# Patient Record
Sex: Male | Born: 1960 | Race: White | Hispanic: No | Marital: Married
Health system: Midwestern US, Community
[De-identification: ages and names within clinical notes are randomized; demographics above are authoritative.]

## PROBLEM LIST (undated history)

## (undated) DIAGNOSIS — E1165 Type 2 diabetes mellitus with hyperglycemia: Principal | ICD-10-CM

## (undated) DIAGNOSIS — Z794 Long term (current) use of insulin: Secondary | ICD-10-CM

---

## 2015-11-27 LAB — AMB EXT LIPID PANEL
HDL, External: 47
LDL-C, External: 78
Total Cholesterol, External: 146
Triglycerides, External: 104

## 2015-11-27 LAB — AMB EXT TSH: TSH, EXTERNAL: 1.21

## 2016-04-08 NOTE — Progress Notes (Signed)
[facility] [address] [place] [address] [phone]      DIABETES & ENDOCRINOLOGY SERVICES    Patient Name: Wayne [name], Wayne [name] Medical Record Number: [id number]  Account Number: [id number] DOB: 06 / 30 / 1962  Admit Date: 08 / 01 / 2017 Discharge Date: / /        DIABETES AND ENDOCRINOLOGY FOLLOWUP OFFICE NOTE    REFERRING PHYSICIAN:  M Jesusita Oka, MD    CONSULTING Kourosh Jablonsky:  Alferd Patee, APRN    SUPERVISING PHYSICIAN:  Marzella Schlein, MD            DATE:  04/08/2016    HISTORY OF PRESENT ILLNESS:  This is a 55 year old male with type 2 diabetes x10 years complicated by   hypertension and hyperlipidemia.  Currently logging and monitoring his blood sugars 2 to 3 times a day using an   Accu-Chek meter.  Pre breakfast 138 to 148.  He did have a blood sugar of 168   and 173 x1 occurrence.  Pre lunch 125, 139, 142, 155, 183.  Pre dinner 89 and   154.  Bedtime 71.  He recently retired in June.  He denies any hypoglycemia unawareness.    CURRENT DIABETES MEDICATIONS:  Actos 30 milligrams p.o. every day.  Januvia 100 milligrams p.o. every day.   Glyburide 10 milligrams p.o. in the morning and at suppertime.  Lantus 45 units   subcutaneously at suppertime.    REVIEW OF SYSTEMS:  Today, the patient denies fever, chills, and unintentional weight gain.  Denies   chest pain, shortness of breath, palpitations, or edema.  Denies cough, wheeze,   or accessory muscle use.  Denies heartburn, abdominal pain, or change in bowel   habits.  Denies incontinence.  Experiences 3 episodes of nocturia a night.   Denies symptoms of peripheral vascular disease or neuropathy    PHYSICAL EXAMINATION:  Weight today on a standing scale is 208.7 pounds.  This is down 1 pound since   last visit.  Blood pressure 110/60 with a pulse of 80.  Well-nourished,   well-appearing, 55 year old male with type 2 diabetes x10 years.  Neck was   supple.  No JVD.  Lung sounds:  Clear to auscultation bilaterally.  No cough,    wheeze, or accessory muscle use.  Heart rate:  Regular rate and rhythm; S1 and   S2 were normal.  Bilateral lower extremities showed no edema, clubbing, or   cyanosis.  Skin was intact.  No ulcers present.  Normal monofilament exam   bilaterally.  +2 dorsalis pedis and posterior tibialis pulses were palpated   bilaterally.  He is alert and oriented x3.    CURRENT DIABETES MEDICATIONS:  Actos 30 milligrams p.o. every day.  Januvia 100 milligrams p.o. every day.   Glyburide 10 milligrams p.o. in the morning and at suppertime.  Lantus 45 units   subcutaneously at suppertime.    ASSESSMENT AND PLAN:  1.  Type 2 diabetes currently out of control.  Moving Lantus administration  time from supper to bedtime.  He needs to monitor for hypoglycemia at bedtime.    He may need to decrease his glyburide as well.  Currently his fasting blood   sugars are not in goal range.  He is to update the office with blood sugars in   1 to 2 weeks to see if Lantus dose needs to be increased.  Discussed the need   for him to increase his blood sugars checks  throughout the day.  He also needs   to monitor his lunch blood sugars.  They are currently split 50/50 whether or   not they are in goal range or not.  Increase activity post breakfast to help   with these numbers.  A new meter was given to him.    2.  Hypertension is currently controlled on current regimen.    3.  Hyperlipidemia is currently controlled on current regimen    4.  Nephropathy.  Microalbumin was within normal limits.    If I can be of any further assistance, please do not hesitate to call.    ______________________________  Alferd Patee, APRN      ______________________________  Marzella Schlein, MD    CC/nls  D:  04/08/2016  10:31 AM  T:  04/08/2016  12:56 PM  DVI  #:  [id number]  Fusion Job #:  [id number]  Doc#:  [id number]    CC: Alferd Patee, APRN  [facility]  [address]  [place], [place] [address] 2013  Fax#:  [phone]  M Jesusita Oka, MD   [address]  [place], [place] [address] [phone]  Marzella Schlein, MD  [facility]  [address]  [place], [place] [address]  Fax#:  [phone]    Electronically Authenticated and Linus Orn by:  Alferd Patee, APRN On 04/09/2016 04:01 PM EDT  Electronically Authenticated by:  Marzella Schlein, MD On 04/10/2016 12:44 PM EDT

## 2016-07-09 NOTE — Progress Notes (Signed)
[facility] [address] [place] [id number] [phone]      DIABETES & ENDOCRINOLOGY SERVICES    Patient Name: Wayne [name] , Wayne [name] Medical Record Number: [id number]  Account Number: [id number] DOB: 06 / 30 / 1962  Admit Date: 11 / 01 / 2017 Discharge Date: / /    REFERRING PHYSICIAN:  Jesusita OkaKevin Cozzi, MD      CONSULTING Ziare Cryder:  Alferd Pateehristine Carver, APRN      SUPERVISING PHYSICIAN:  Janice CoffinElizabeth Spatola, MD      DATE:  07/09/2016    HISTORY OF PRESENT ILLNESS:  This is a 55 year old male with type 2 diabetes x10 years complicated by   hypertension and hyperlipidemia.  Currently logging and monitoring his blood   sugars 2 to 3 times a day using an Accu-Chek Aviva meter.  He varies his Lantus   dose from 40 to 45 units subcutaneous at h.s. depending on his  activity that he had previously in the day.  This is to prevent hypoglycemia.  He currently denies any hypoglycemia.    CURRENT DIABETES MEDICATIONS:  Januvia 100 mg p.o. every day, Actos 30 mg p.o. every day, glyburide 10 mg p.o.   in the morning and at suppertime, Lantus 40 to 45 units subcutaneous at h.s.   depending on previous activity of the day.    REVIEW OF SYSTEMS:  Today the patient denies fever, chills or unintentional weight gain.  Denies   chest pain, shortness of breath, palpitations or edema.  Denies cough, wheeze  or accessory muscle use.  Denies heartburn, abdominal pain or change in bowel   habits.  Denies nocturia or incontinence.  Denies symptoms of PVD or neuropathy.    PHYSICAL EXAMINATION:  Weight today on a standing scale is 212.2.  This is up 4 pounds.  Blood  pressure 132/62 with a pulse of 89.  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  Neck was supple.  No JVD.  Lung sounds clear to auscultation bilaterally.  No cough, wheeze or accessory   muscle use.  Heart rate:  Regular rate and rhythm.  S1, S2 was normal.  Bilateral extremities show no edema, clubbing or cyanosis.  Skin was intact.   No ulcers present.  Normal monofilament exam.  Plus 2 dorsalis pedis and   posterior tibialis pulses palpated bilaterally.  He is alert and oriented x3.    CURRENT DIABETES MEDICATIONS:  Januvia 100 mg p.o. every day, Actos 30 mg p.o. every day, glyburide 10 mg p.o.   in the morning and at suppertime, Lantus 40 to 45 units subcutaneous at h.s.   depending on activity of the day.    ASSESSMENT AND PLAN:  1.  Type 2 diabetes, currently controlled.  He does very well with ranging his   Lantus based off of previous activity of the day to prevent hypoglycemia.  His   A1c continues to improve. It has decreased from 7.7 to 7.2 on the [facility] Vantage   analyzer machine. No change is made today.  He is to call with any concerns.  2.  Hypertension is currently controlled on current regimen.  3.  Hyperlipidemia is currently controlled on current regimen.  4.  Nephropathy.  Microalbumin is within normal limits.    If I can be of any further assistance, please do not hesitate to call.            ______________________________  Alferd Pateehristine Carver, APRN  ______________________________  Marzella SchleinElizabeth J Spatola, MD    CC/pc  D:  07/09/2016  11:30 AM  T:  07/10/2016  09:56 PM  DVI  #:  [id number]  Fusion Job #:  [id number]  Doc#:  [id number]    CC: Alferd Pateehristine Carver, APRN  [facility]  [address]  [place], [place] [id number] 2013  Fax#:  [phone]  Coralie CommonM Kevin Cozzi, MD  [address]  [place], [place] [id number] [phone]  Marzella SchleinElizabeth J Spatola, MD  [facility]  [address]  [place], [place] [id number]  Fax#:  [phone]    Electronically Authenticated and Linus OrnEdited by:  Alferd Pateehristine Carver, APRN On 07/11/2016 09:44 AM EDT  Electronically Authenticated by:  Marzella SchleinElizabeth J Spatola, MD On 07/18/2016 10:08 AM EST

## 2016-10-16 NOTE — Progress Notes (Signed)
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 Currently logging and monitoring his blood   sugars 1-2 times a day using an Accu-Chek Aviva meter.  [place]   [place], he came back 2 days ago. He states during that time he   was not doing his typical activity and diet.  He notes that he did have 2 blood   sugars of 76 and 78 after exercise, without a snack, when he took glyburide.    He is hypoglycemic aware.    MEDICATIONS:  Januvia 100 mg p.o. every day, Actos 30 mg p.o. every day, glyburide 10 mg p.o.   in the morning and at suppertime, and Lantus 40-45 units subcu at h.s.   depending on activity and diet.    REVIEW OF SYSTEMS:  Today, the patient denies fever, chills, or unintentional weight gain.  Denies   chest pain, shortness of breath, palpitations, or edema.  Denies cough, wheeze,   or accessory muscle use.  Denies heartburn, abdominal pain, or change in bowel   habits.  Denies nocturia or incontinence.  Denies symptoms of PVD or neuropathy.    PHYSICAL EXAM:  VITAL SIGNS:  Weight today on a standing scale 210.2; this is down 2 pounds.    Blood pressure 112/62 with a pulse of 74.  GENERAL:  Well-nourished, well-appearing 56 year old male with type 2 diabetes   x10 years.  NECK:  Supple.  No JVD.     LUNGS:  Clear to auscultation bilaterally.  No cough, wheeze, or accessory   muscle use.  HEART:  Regular rate and rhythm.  S1, S2 normal.  EXTREMITIES:  Bilateral extremities show no edema, clubbing, or cyanosis.  Skin    was intact.  No ulcers present.  Normal monofilament exam.  +2 dorsalis pedis   and posterior tibialis pulses palpated bilaterally.  NEUROLOGIC:  He is alert and oriented x3.    ASSESSMENT AND PLAN:  1. Type 2 diabetes, currently out of control.  [place] was in [place]   approximately 3 weeks with poor diet and activity regimen at that time.  We   will monitor his blood sugars and A1c now that he is back to his typical diet   and activity.  It has increased from 7.2 to 7.5 on the [facility] Vantage Analyzer   machine.  He is going to update the office with his blood sugars to see whether   or not medications need to be changed now that he is back home.  Also discussed   that he should monitor his   hypoglycemia with exercise and glyburide administration.  Discussed he may need   to cut his dose in half or eliminate glyburide completely on the days that he   exercises.  He states he is comfortable exercising with taking the full dose of   glyburide if he eats a snack.  He says there were only 2 occurrences of  blood   sugars in the upper seventie. He is hypoglycemic aware.  2. Hypertension is currently controlled on current regimen.  3. Hyperlipidemia is currently controlled on current regimen.  4. Nephropathy.  Microalbumin is within normal limits.    If I can be of any further assistance, please do not hesitate to call.        ____________________________________  Otho Darner, APRN      ____________________________________  Marzella Schlein, MD    CC/MODL  D:  10/16/2016 11:12:53  T:  10/16/2016 11:28:55  MMODAL  Job#: [id number]  Doc#:  [id number]      CC: Coralie Common, MD  Electronically Authenticated and Edited by:  Otho Darner On 10/17/2016 03:48 PM EST  Electronically Authenticated by:  Marzella Schlein, MD On 10/28/2016 10:06 AM EST

## 2017-03-06 ENCOUNTER — Ambulatory Visit: Attending: "Endocrinology

## 2017-04-17 MED ORDER — BLOOD SUGAR DIAGNOSTIC TEST STRIPS
ORAL_STRIP | 3 refills | Status: AC
Start: 2017-04-17 — End: ?

## 2017-04-17 NOTE — Telephone Encounter (Signed)
Accu chek diabetic test strips   Twice a day  90 day supply

## 2017-05-17 MED ORDER — ROSUVASTATIN 20 MG TAB
20 mg | ORAL_TABLET | ORAL | 3 refills | Status: DC
Start: 2017-05-17 — End: 2018-06-13

## 2017-06-02 ENCOUNTER — Encounter: Attending: "Endocrinology

## 2017-07-16 MED ORDER — PIOGLITAZONE 30 MG TAB
30 mg | ORAL_TABLET | ORAL | 3 refills | Status: AC
Start: 2017-07-16 — End: ?

## 2017-07-20 MED ORDER — PEN NEEDLE, DIABETIC 32 GAUGE X 5/32"
32 gauge x 5/" | PEN_INJECTOR | 3 refills | Status: DC
Start: 2017-07-20 — End: 2018-07-28

## 2017-08-25 ENCOUNTER — Ambulatory Visit: Admit: 2017-08-25 | Discharge: 2017-08-25 | Payer: PRIVATE HEALTH INSURANCE | Attending: "Endocrinology

## 2017-08-25 DIAGNOSIS — E1165 Type 2 diabetes mellitus with hyperglycemia: Secondary | ICD-10-CM

## 2017-08-25 LAB — AMB POC HEMOGLOBIN A1C: Hemoglobin A1c (POC): 7.5 %

## 2017-08-25 MED ORDER — DAPAGLIFLOZIN 5 MG TABLET
5 mg | ORAL_TABLET | Freq: Every day | ORAL | 3 refills | Status: DC
Start: 2017-08-25 — End: 2017-09-09

## 2017-08-25 NOTE — Progress Notes (Addendum)
[facility] DI[facility] AT THE [facility]         Patient Name: Wayne MuttersHarold Mersman   MRN: [id number]  DOB: 1961-08-24  Date: 08/25/17      CONSULTING PHYSICIAN:  Marzella SchleinElizabeth J Lee-Anne Flicker, MD    DATE OF CONSULT:   08/25/17        HISTORY OF PRESENT ILLNESS:     Wayne [name] is a 56 y.o. male with type 2 diabetes times 11 years who presents for a follow up visit.  The patient has not been here for 10 months.   The patients diabetes is complicated by hypertension and hyperlipidemia. The patient is testing his  blood sugars 2 times per day although he has periods of time when he does not check his blood sugars at all. The patient is using a Accu-Chek of Aviva meter. The patient is exercising.  Patient exercises 3-4 times per week biking, weight lifting or boxing.    The patient is currently taking   Basaglar 45 units at bedtime  Actos 30 mg a day  Januvia 100 mg a day  Glyburide 10 mg twice a day    The patient does feel his low blood sugars.  His only low readings occur after exercise in the afternoon.  The patient does not feel of this happens often    Bydureon side effect nausea    Review of Systems     Patient denies any chest pain shortness of breath claudication symptoms or neuropathic discomfort      Diabetes Medication   Key Antihyperglycemic Medications             insulin glargine (BASAGLAR KWIKPEN U-100 INSULIN) 100 unit/mL (3 mL) inpn  (Taking) by SubCUTAneous route. 45 units daily    dapagliflozin (FARXIGA) 5 mg tab tablet  (Taking) Take 1 Tab by mouth daily.    pioglitazone (ACTOS) 30 mg tablet  (Taking) TAKE 1 TABLET DAILY.    glyBURIDE (DIABETA) 5 mg tablet  (Taking) Take 10 mg by mouth two (2) times daily (with meals).    SITagliptin (JANUVIA) 100 mg tablet  (Taking) Take 100 mg by mouth daily.                    Visit Vitals  BP 120/62   Pulse 84   Wt 212 lb 1.6 oz (96.2 kg)       PHYSICAL EXAMINATON:    General:  Well-nourished well-developed male   Lungs:  Clear to auscultation  Heart:  Regular rate and rhythm S1-S2 were normal  Neuro:  Alert and oriented x3, cranial nerves 2-12 intact, normal monofilament exam  Extremities:  No clubbing cyanosis or edema, 1+ dorsalis pedis pulses         Results for orders placed or performed in visit on 08/25/17   AMB POC HEMOGLOBIN A1C   Result Value Ref Range    Hemoglobin A1c (POC) 7.5 %           ASSESSMENT AND PLAN:    This is a 56 y.o. year old  male with type 2 diabetes times 11 years    # 1: Diabetes:   Out of control.  Patient's A1c has been stable in the 7.2-7.5 range.  He was tried on Bydureon and developed nausea.  He also was tried on Invokana and in a very short amount of time just felt awful and could not get out of bed.  I am wondering if this was really  related to the Invokana.  I have asked him to try ComorosFarxiga which he is willing to do.  His A1c is elevated and would do better at a lower level.  If he cannot tolerate the SGL T2 then we will change him to short-acting insulin along with the Basaglar.    #2: Lipids:  Patient continues on Crestor    #3: Nephropathy:  The patient needs to have a microalbumin and GFR done    #4: HTN:   Patient continues on hydrochlorothiazide and Cozaar    #5: PVD:  Patient denies claudication symptoms    #6: Neuropathy:  Patient denies neuropathic discomfort    #7: Cardiac:  Patient denies chest pain or shortness of breath    #8: Retinopathy:  Patient [facility]      If I can be of any further assistance please do not hesitate to call.      Marzella SchleinElizabeth J Kharis Lapenna, MD

## 2017-08-25 NOTE — Telephone Encounter (Signed)
Called patient to inform him that once he starts the ComorosFarxiga med, he is to stop the HCTZ, per Dr Barrie FolkSpatola. Patient is receptive

## 2017-09-04 ENCOUNTER — Inpatient Hospital Stay: Admit: 2017-09-04 | Payer: PRIVATE HEALTH INSURANCE

## 2017-09-04 DIAGNOSIS — E1165 Type 2 diabetes mellitus with hyperglycemia: Secondary | ICD-10-CM

## 2017-09-04 LAB — MICROALBUMIN, UR, RAND
Creatinine, urine random: 46.9 mg/dL
Microalbumin, urine: <5 mg/L

## 2017-09-04 LAB — METABOLIC PANEL, BASIC
Anion gap: 1 mmol/L — ABNORMAL LOW (ref 5–15)
BUN/Creatinine ratio: 22 — ABNORMAL HIGH (ref 12–20)
BUN: 21 MG/DL — ABNORMAL HIGH (ref 7–18)
CO2: 29 mmol/L (ref 21–32)
Calcium: 8.9 MG/DL (ref 8.5–10.1)
Chloride: 107 mmol/L (ref 98–110)
Creatinine: 0.96 MG/DL (ref 0.70–1.30)
GFR est AA: >60 mL/min/{1.73_m2} (ref >60)
GFR est non-AA: >60 mL/min/{1.73_m2} (ref >60)
Glucose: 136 mg/dL — ABNORMAL HIGH (ref 70–100)
Potassium: 4.9 mmol/L (ref 3.5–5.1)
Sodium: 137 mmol/L (ref 136–145)

## 2017-09-04 LAB — HEPATIC FUNCTION PANEL
A-G Ratio: 1.2 (ref 1.0–3.0)
ALT (SGPT): 41 U/L (ref 16–61)
AST (SGOT): 29 U/L (ref 15–37)
Albumin: 3.8 g/dL (ref 3.4–5.0)
Alk. phosphatase: 65 U/L (ref 45–117)
Bilirubin, direct: 0.16 mg/dL (ref 0.00–0.20)
Bilirubin, total: 0.46 mg/dL (ref 0.20–1.20)
Globulin: 3.3 g/dL (ref 2.5–3.5)
Protein, total: 7.1 g/dL (ref 6.4–8.2)

## 2017-09-04 LAB — LIPID PANEL W/ REFLX DIRECT LDL
CHOL/HDL Ratio: 3.4 (ref 1–3.4)
Cholesterol, total: 169 MG/DL (ref 0–200)
HDL Cholesterol: 49 MG/DL — ABNORMAL LOW (ref >59)
LDL, calculated: 90.8 MG/DL (ref <100)
N-HDL-C: 120 mg/dL (ref <130)
Triglyceride: 146 MG/DL (ref 0–150)

## 2017-09-09 MED ORDER — DAPAGLIFLOZIN 5 MG TABLET
5 mg | ORAL_TABLET | Freq: Every day | ORAL | 3 refills | Status: DC
Start: 2017-09-09 — End: 2018-07-28

## 2017-09-09 NOTE — Telephone Encounter (Signed)
Hal needs a prescription for Farxiga 5 mg tabs, 1 daily, #90  called to [facility].  [facility] would not pay for it.

## 2017-09-09 NOTE — Telephone Encounter (Signed)
Completed.

## 2017-09-30 MED ORDER — GLYBURIDE 5 MG TAB
5 mg | ORAL_TABLET | Freq: Two times a day (BID) | ORAL | 3 refills | Status: AC
Start: 2017-09-30 — End: ?

## 2017-09-30 MED ORDER — SITAGLIPTIN 100 MG TAB
100 mg | ORAL_TABLET | Freq: Every day | ORAL | 3 refills | Status: AC
Start: 2017-09-30 — End: ?

## 2017-09-30 NOTE — Telephone Encounter (Signed)
Wayne [name] needs a refill of his Januvia 100 mg.  He takes 1 tab daily, 90 day supply.  He also needs Glyburide 5 mg. 2 tabs twice a day, 90 day supply    [facility]

## 2017-09-30 NOTE — Telephone Encounter (Signed)
Completed.

## 2017-11-25 ENCOUNTER — Encounter: Attending: Registered Nurse

## 2018-01-04 ENCOUNTER — Ambulatory Visit: Admit: 2018-01-04 | Discharge: 2018-01-04 | Payer: PRIVATE HEALTH INSURANCE | Attending: Registered Nurse

## 2018-01-04 DIAGNOSIS — E1165 Type 2 diabetes mellitus with hyperglycemia: Secondary | ICD-10-CM

## 2018-01-04 LAB — AMB POC HEMOGLOBIN A1C: Hemoglobin A1c (POC): 6.2 %

## 2018-01-04 NOTE — Progress Notes (Signed)
[facility] DIABETES & ENDOCRINOLOGY SERVICES AT THE CARDIOVASCULAR CENTER         Patient Name: Wayne [name]   MRN: [id number]  DOB: 06-14-1961  Date: 01/04/18    Consulting provider:  Otho Darner, FNP    Supervising physician:  Marzella Schlein, MD    DATE OF CONSULT:   01/04/18        HISTORY OF PRESENT ILLNESS:  Wayne [name] is a 57 y.o. male with type 2 diabetes times 11 years complicated by hypertension and hyperlipidemia who presents for a follow up visit.  Currently monitoring his blood sugars one to 2 times a day using Accu-Chek of even meter.  He does not have his meter or log book with him.  He denies any hypoglycemia.  He just returned from [place].  He was down there for approximately three weeks.  He may be moving down to [place] next winter permanently.  They have not decided whether or not they want to permantely move and sell their house in [place]. He is actively trying to lose weight.  He is monitoring his portion size as well as decrease his snacking post supper and prior to going to bed.  He is currently working out three to 4 times a week.  Commonly using the treadmill, weights and recumbent bike.  He is currently taking glyburide 5-10 mg by mouth twice daily.  He ranges his dose based on his diet and activity.  He also decreased his basalglar insulin and is taking 40 units subcutaneously daily.  He previously was ranging 40-45 units before he consistently started taking 40 units.      Review of Systems   Today the patient denies fever, chills or unintentional weight gain.  Denies chest pain, shortness of breath, palpitations or edema.  Denies cough, wheeze or accessory muscle use.  Denies heartburn, abdominal pain or change in bowel habits.  Denies nocturia incontinence.  Denies symptoms of PVD or neuropathy.      Diabetes Medication   Key Antihyperglycemic Medications             SITagliptin (JANUVIA) 100 mg tablet Take 1 Tab by mouth daily.     glyBURIDE (DIABETA) 5 mg tablet Take 2 Tabs by mouth two (2) times daily (with meals).    dapagliflozin (FARXIGA) 5 mg tab tablet Take 1 Tab by mouth daily.    insulin glargine (BASAGLAR KWIKPEN U-100 INSULIN) 100 unit/mL (3 mL) inpn by SubCUTAneous route. 40-45 units daily    pioglitazone (ACTOS) 30 mg tablet TAKE 1 TABLET DAILY.                    Visit Vitals  BP 116/62 (BP 1 Location: Right arm, BP Patient Position: Sitting)   Pulse 64   Wt 205 lb (93 kg)       PHYSICAL EXAMINATON:  General:  Well-nourished well-appearing 57 year old male with type 2 diabetes times 11 years.  Neck/thyroid:   Supple, no JVD.  Lungs:  Clear to auscultation bilaterally.  No cough, wheeze or accessory muscle use.  Heart:  Regular rate and rhythm.  S1-S2 is normal.  Extremities:  Bilateral extremities show no edema, clubbing or cyanosis.  Skin was intact. No ulcers present.  Normal monofilament exam bilaterally.  Plus two dorsalis pedis and posterior tibialis pulses palpated bilaterally.  Neuro:  Alert and oriented x3.    Results for orders placed or performed in visit on 01/04/18   AMB POC HEMOGLOBIN A1C  Result Value Ref Range    Hemoglobin A1c (POC) 6.2 %         ASSESSMENT AND PLAN:    This is a 57 y.o. year old  male with type 2 diabetes times 11 years.    1: Diabetes:   Controlled.  His A1c has decreased from 7.5-6.2.  He is extremely happy with this result.  His blood sugars have  done very well since started on Farxiga.  Discussed we do not want his A1c to go less than 6.0.  He is to trial decreasing his glyburide twice daily.  Currently he is taking 10 mg by mouth twice daily.  He is to take 5 mg by mouth twice daily.  Discussed for him to trial at 0 mg on days that he is active to see how his blood sugars respond.  He understands he needs to re-evaluate his blood sugars post exercise and or add a decreased dose of his medications.  He is reminded to monitor for hypoglycemia with glyburide and basalglar  administration.  He understands he is at risk for hypoglycemia with increased weight loss, activity and small food portion sizes with these two medications specifically.  Reviewed blood sugar goals he is to strive for throughout the day.  Reviewed the positive benefits that lifestyle changes has on his blood sugars and A1c results.  He feels comfortable treating a hypoglycemic event.  He is to continue to check his blood sugars at various times throughout the day.  He is to call the office with any questions or concerns.    2: HTN:  Controlled.    3: Lipids:   Controlled.    4: Nephropathy:  Microalbumin was within normal limits.    5. Retinopathy:    Last examination November 25 2016 at [facility]. Per patient, he has a follow-up appointment scheduled next week.     If I can be of any further assistance please do not hesitate to call.      Otho Darner, FNP

## 2018-02-08 MED ORDER — INSULIN GLARGINE 100 UNIT/ML (3 ML) SUB-Q PEN
100 unit/mL (3 mL) | PEN_INJECTOR | Freq: Every day | SUBCUTANEOUS | 3 refills | Status: DC
Start: 2018-02-08 — End: 2019-02-22

## 2018-02-08 NOTE — Telephone Encounter (Signed)
Wayne [name] needs a refill of his Basaglar 40 mg.  He takes 1 daily and gets a 90 day supply.

## 2018-02-08 NOTE — Telephone Encounter (Signed)
Completed.

## 2018-04-05 ENCOUNTER — Ambulatory Visit: Admit: 2018-04-05 | Discharge: 2018-04-05 | Payer: PRIVATE HEALTH INSURANCE | Attending: Registered Nurse

## 2018-04-05 ENCOUNTER — Ambulatory Visit

## 2018-04-05 DIAGNOSIS — E1165 Type 2 diabetes mellitus with hyperglycemia: Secondary | ICD-10-CM

## 2018-04-05 LAB — AMB POC HEMOGLOBIN A1C
Hemoglobin A1C, POC: 6.4 %
Hemoglobin A1c (POC): 6.4 %

## 2018-04-05 NOTE — Progress Notes (Signed)
Progress Notes by Otho Darnerhristophersen, Anthea Udovich at 04/05/18 1130                Author: Otho Darnerhristophersen, Johnwilliam Shepperson  Service: --  Author Type: Nurse Practitioner       Filed: 04/05/18 1208  Encounter Date: 04/05/2018  Status: Signed          Editor: Joanthony Hamza, [facility]                          [facility]             Patient Name: Wayne [name]    MRN: [id number]   DOB: February 15, 1961   Date: 04/05/18      Consulting provider:   Otho Darnerhristine Tyqwan Pink, FNP      Supervising physician:   Wayne SchleinElizabeth J Spatola, MD      DATE OF CONSULT:    04/05/18            HISTORY OF PRESENT ILLNESS:   Wayne [name] is a 57 y.o.  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  Currently monitoring his blood sugars one to 2 times a day using Accu-Chek  Avia meter.  Unfortunately, he does not have his meter or log book with him.   Per patient, his blood sugars highest at 120 in the morning.  He had two blood sugars less than 80 when he took his medication and did not eat.  He denies  this as a recurring occurrence.  He is hypoglycemic aware and does not need assistance from others.  He feels comfortable treating a hypoglycemic event.  He admits he has had increased amount of stress the past 3-4 weeks. He is currently was in the [place]  [place].   Due to him being busy with moving and selling HIS [place] home he has also had decreased exercise regimen.         Review of Systems    Today the patient denies fever, chills or unintentional weight gain.  Denies chest pain, shortness of breath, palpitations or edema.  Denies cough, wheeze or accessory muscle use.  Denies heartburn,  abdominal pain or change in bowel habits.  Denies nocturia incontinence.  Denies symptoms of PVD or neuropathy.         Diabetes Medication      Key  Antihyperglycemic Medications                             insulin glargine (BASAGLAR KWIKPEN U-100 INSULIN) 100 unit/mL (3 mL) inpn  40 Units by SubCUTAneous route daily.           SITagliptin (JANUVIA) 100 mg tablet  Take 1 Tab by mouth daily.       glyBURIDE (DIABETA) 5 mg tablet  Take 2 Tabs by mouth two (2) times daily (with meals).       dapagliflozin (FARXIGA) 5 mg tab tablet  Take 1 Tab by mouth daily.           pioglitazone (ACTOS) 30 mg tablet  TAKE 1 TABLET DAILY.                               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   Neck/thyroid:   Supple, no JVD.   Lungs:  Clear to auscultation bilaterally.  No cough, wheeze or accessory muscle use.   [name]:  Regular rate and rhythm.  S1-S2 is normal.   Extremities:  Bilateral extremities show no edema, clubbing or cyanosis.  Skin was intact. No ulcers present.  Normal monofilament exam bilaterally.  Plus two dorsalis pedis and posterior tibialis pulses palpated bilaterally.   Neuro:  Alert and oriented x3.        Results for orders placed or performed in visit on 04/05/18     AMB POC HEMOGLOBIN A1C         Result  Value  Ref Range            Hemoglobin A1c (POC)  6.4  %              ASSESSMENT AND PLAN:      This is a 57 y.o. year old  male  with type 2 diabetes times 11 years.      1: Diabetes:   Controlled.  His A1c has increased from 6.2-6.4.  Per patient, his blood sugars are in goal range.  He denies hypoglycemia besides two occurrences when he took his medication and did not eat. He understands he needs to prevent this from  occurring.  We will continue with current medications as ordered.  He is going to be moving to [place] permanently.  He understands he can call our office with any concerns while he was in [place] until he  sees a provider down there.  He understands the  positive benefits that lifestyle choices will have on his blood sugar and A1c results.  He is to continue to check his blood sugars at various times throughout the day.  He is update the office with blood sugars and call with any concerns.                                                2: HTN:  Controlled.      3: Lipids:   Controlled.      4: Nephropathy:  Microalbumin was within normal limits.      5. Retinopathy:    Last examination November 25 2016 at [facility].      If I can be of any further assistance please do not hesitate to call.         Otho Darner, FNP

## 2018-04-05 NOTE — Progress Notes (Signed)
[facility]         Patient Name: Wayne [name]   MRN: [id number]  DOB: Aug 17, 1961  Date: 04/05/18    Consulting provider:  Otho Darnerhristine Kamuela Magos, FNP    Supervising physician:  Marzella SchleinElizabeth J Spatola, MD    DATE OF CONSULT:   04/05/18        HISTORY OF PRESENT ILLNESS:  Wayne [name] is a 57 y.o. 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  Currently monitoring his blood sugars one to 2 times a day using Accu-Chek Avia meter.  Unfortunately, he does not have his meter or log book with him.   Per patient, his blood sugars ???highest at 120 in the morning???.  He had two blood sugars less than 80 when he took his medication and did not eat.  He denies this as a recurring occurrence.  He is hypoglycemic aware and does not need assistance from others.  He feels comfortable treating a hypoglycemic event.  He admits he has had increased amount of stress the past 3-4 weeks. He is currently was [place].   Due to him being busy with moving and selling HIS [place] home he has also had decreased exercise regimen.      Review of Systems   Today the patient denies fever, chills or unintentional weight gain.  Denies chest pain, shortness of breath, palpitations or edema.  Denies cough, wheeze or accessory muscle use.  Denies heartburn, abdominal pain or change in bowel habits.  Denies nocturia incontinence.  Denies symptoms of PVD or neuropathy.      Diabetes Medication   Key Antihyperglycemic Medications             insulin glargine (BASAGLAR KWIKPEN U-100 INSULIN) 100 unit/mL (3 mL) inpn 40 Units by SubCUTAneous route daily.    SITagliptin (JANUVIA) 100 mg tablet Take 1 Tab by mouth daily.    glyBURIDE (DIABETA) 5 mg tablet Take 2 Tabs by mouth two (2) times daily (with meals).     dapagliflozin (FARXIGA) 5 mg tab tablet Take 1 Tab by mouth daily.    pioglitazone (ACTOS) 30 mg tablet TAKE 1 TABLET DAILY.                    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  Neck/thyroid:   Supple, no JVD.  Lungs:  Clear to auscultation bilaterally.  No cough, wheeze or accessory muscle use.  Heart:  Regular rate and rhythm.  S1-S2 is normal.  Extremities:  Bilateral extremities show no edema, clubbing or cyanosis.  Skin was intact. No ulcers present.  Normal monofilament exam bilaterally.  Plus two dorsalis pedis and posterior tibialis pulses palpated bilaterally.  Neuro:  Alert and oriented x3.    Results for orders placed or performed in visit on 04/05/18   AMB POC HEMOGLOBIN A1C   Result Value Ref Range    Hemoglobin A1c (POC) 6.4 %         ASSESSMENT AND PLAN:    This is a 57 y.o. year old  male with type 2 diabetes times 11 years.    1: Diabetes:   Controlled.  His A1c has increased from 6.2-6.4.  Per patient, his blood sugars are in goal range.  He denies hypoglycemia besides two occurrences when he took his medication and did not eat. He understands he needs to prevent this from occurring.  We will continue with current medications as ordered.  He is going to be moving to [place] permanently.  He understands he can call our office with any concerns while he was in [place] until he sees a provider down there.  He understands the positive benefits that lifestyle choices will have on his blood sugar and A1c results.  He is to continue to check his blood sugars at various times throughout the day.  He is update the office with blood sugars and call with any concerns.                                             2: HTN:  Controlled.    3: Lipids:   Controlled.     4: Nephropathy:  Microalbumin was within normal limits.    5. Retinopathy:    Last examination November 25 2016 at [facility].    If I can be of any further assistance please do not hesitate to call.      Otho Darner, FNP

## 2018-06-14 MED ORDER — ROSUVASTATIN 20 MG TAB
20 mg | ORAL_TABLET | ORAL | 0 refills | Status: AC
Start: 2018-06-14 — End: ?

## 2018-07-28 ENCOUNTER — Encounter

## 2018-07-28 MED ORDER — DAPAGLIFLOZIN 5 MG TABLET
5 mg | ORAL_TABLET | Freq: Every day | ORAL | 3 refills | Status: DC
Start: 2018-07-28 — End: 2018-10-25

## 2018-07-28 MED ORDER — PEN NEEDLE, DIABETIC 32 GAUGE X 5/32"
32 gauge x 5/" | PEN_INJECTOR | 3 refills | Status: AC
Start: 2018-07-28 — End: ?

## 2018-07-28 NOTE — Telephone Encounter (Signed)
dapagliflozin (FARXIGA) 5 mg tab tablet    Insulin Needles, Disposable, (NANO PEN NEEDLE) 32 gauge x 5/32"    Patient states [facility] has sent these already.  No faxes found.    Patient [place] to find a new [place] in [place]  Moved 3 weeks ago.

## 2018-07-28 NOTE — Telephone Encounter (Signed)
Last apt: 04/05/2018  Next apt: NA  Last apt: 04/05/2018

## 2018-10-25 ENCOUNTER — Encounter

## 2018-10-25 MED ORDER — DAPAGLIFLOZIN 5 MG TABLET
5 mg | ORAL_TABLET | ORAL | 1 refills | Status: AC
Start: 2018-10-25 — End: ?

## 2018-10-25 NOTE — Telephone Encounter (Signed)
Last apt: 04/05/2018  Next apt: NA  Last labs: 04/05/2018

## 2018-10-25 NOTE — Telephone Encounter (Signed)
re : Marcelline Deist from [facility]    See attached media document

## 2019-02-22 ENCOUNTER — Encounter

## 2019-02-22 MED ORDER — BASAGLAR KWIKPEN U-100 INSULIN 100 UNIT/ML (3 ML) SUBCUTANEOUS
100 unit/mL (3 mL) | PEN_INJECTOR | Freq: Every day | SUBCUTANEOUS | 3 refills | Status: AC
Start: 2019-02-22 — End: ?

## 2019-02-22 NOTE — Telephone Encounter (Signed)
BASAGLAR KWIK PEN 100 U/ML   INJECT 40 UNITS DAILY

## 2019-02-22 NOTE — Telephone Encounter (Signed)
Last apt: 04/05/2018  Next apt: NA  Last labs: 04/05/2018

## 2020-09-20 NOTE — Telephone Encounter (Signed)
Patient now lives in [place] he said to thank you for all the care he received over the years.

## 2022-05-09 IMAGING — MR MRI LUMBAR SPINE WITHOUT CONTRAST
4 of 6 series · 11 of 48 positions shown · IV contrast (gadolinium)
Comparison: None.

________________________________________________________________________________________________ 
******** ADDENDUM #1 ********/n 
Comparison is made to MRI from March 24, 2019 which is now available. 
Since that examination, the disc bulge at L4-L5 has progressed with progression 
in degree of central canal and bilateral subarticular recess narrowing. There is 
a residual left foraminal disc herniation at L4-L5 which has improved in size 
since prior MRI, resulting in improvement in left neural foraminal narrowing. 
Remainder of the exam is stable. 
MRI LUMBAR SPINE WITHOUT CONTRAST, 05/09/2022 [DATE]: 
CLINICAL INDICATION: Pain across lower back which radiates down buttocks and 
bilateral legs.
TECHNIQUE: Multiplanar, multiecho position MR images of the lumbar spine were 
performed without intravenous gadolinium enhancement. Patient was scanned on a 
3T magnet.

[Series 201: survey · axial · 10.0mm · 1.39mm/px · z∈[+152,+367]mm · 3 of 9 slices shown]
[im 1/9]
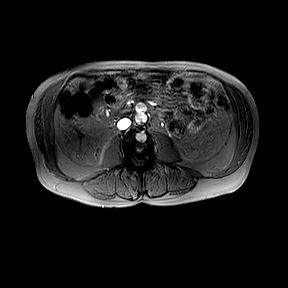
[im 6/9]
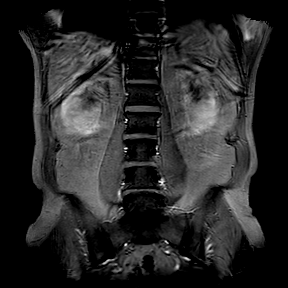
[im 9/9]
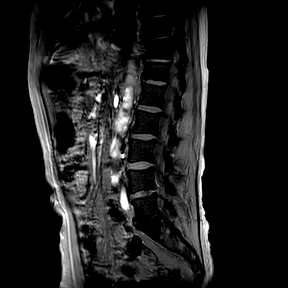

[Series 301: t2w_cor-surv · coronal · 6.0mm · 0.50mm/px · 2 of 5 slices shown]
[im 1/5]
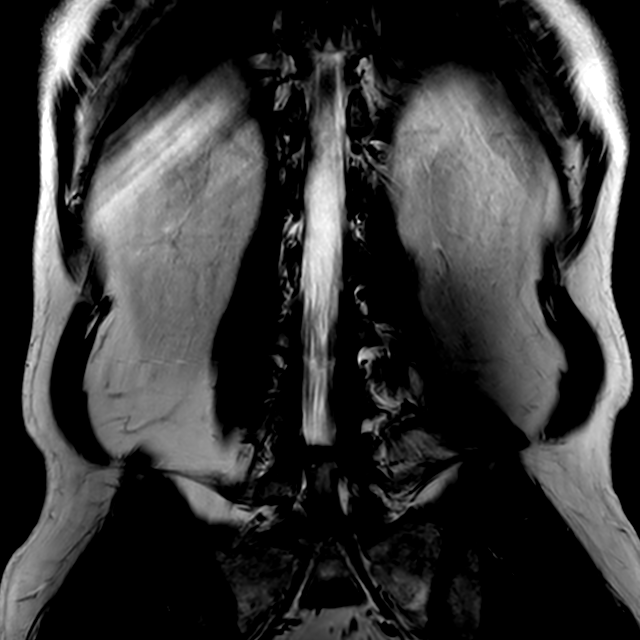
[im 5/5]
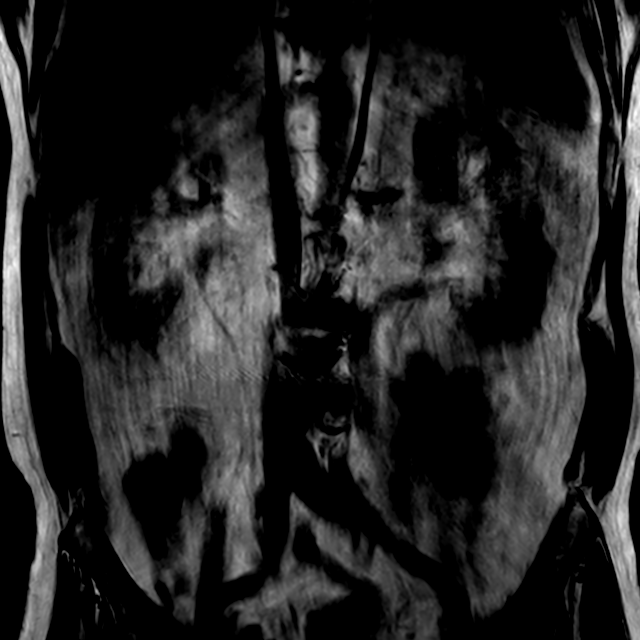

[Series 401: t2w_tse sag · sagittal · 4.0mm · 0.31mm/px · 3 of 17 slices shown]
[im 3/17]
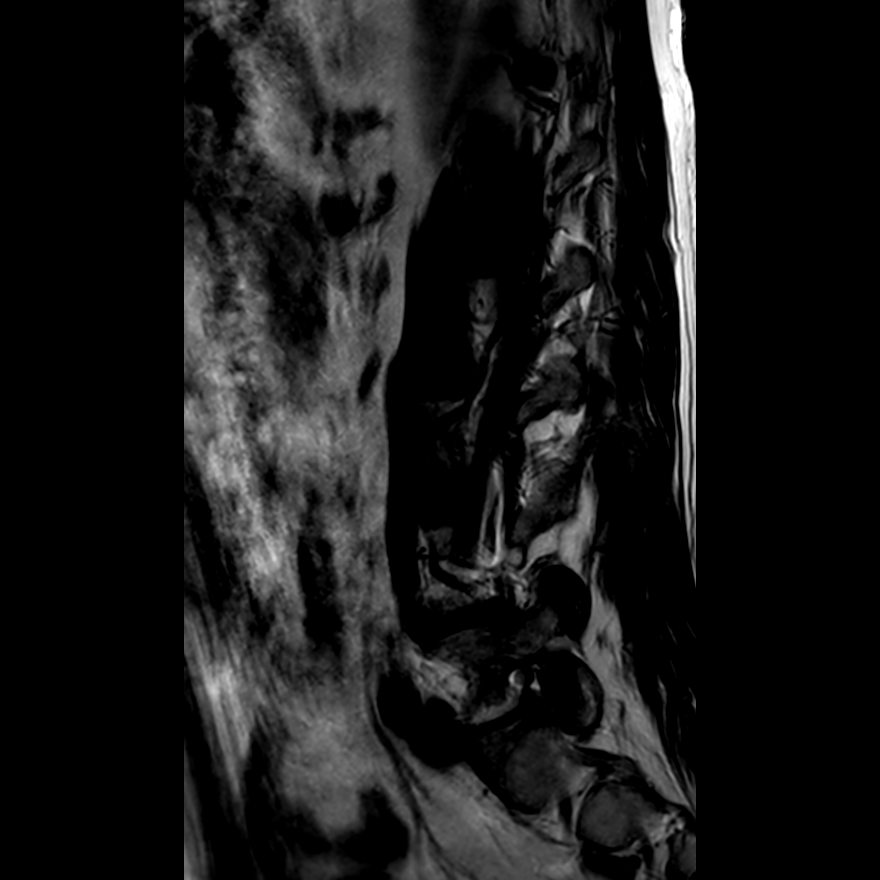
[im 9/17]
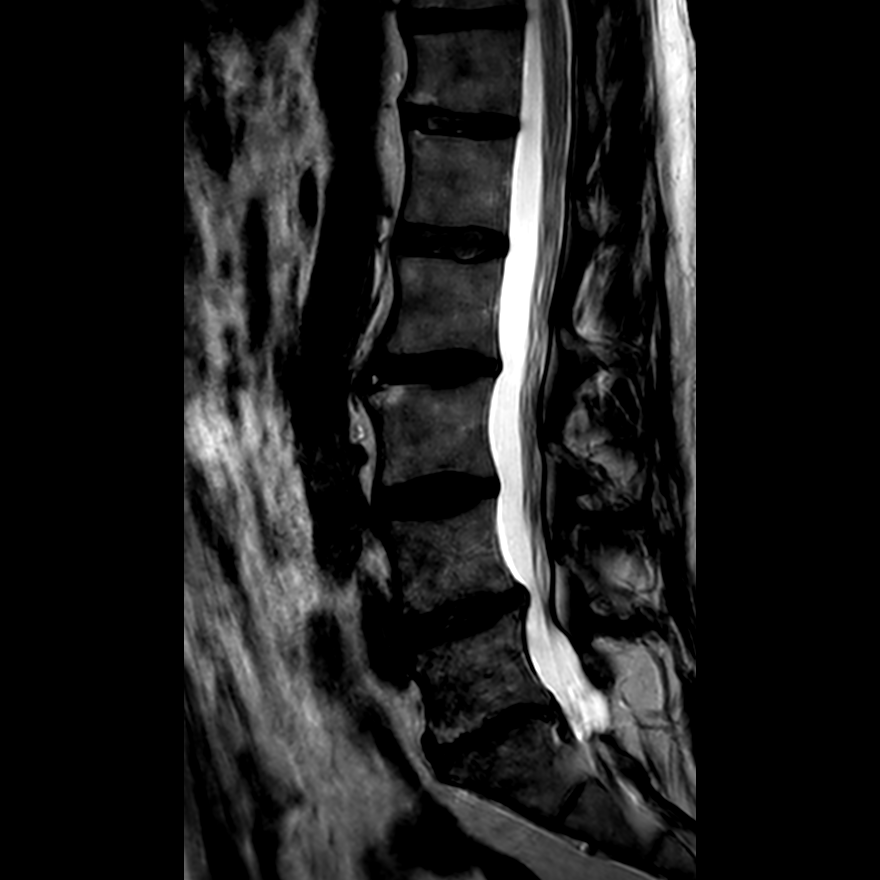
[im 15/17]
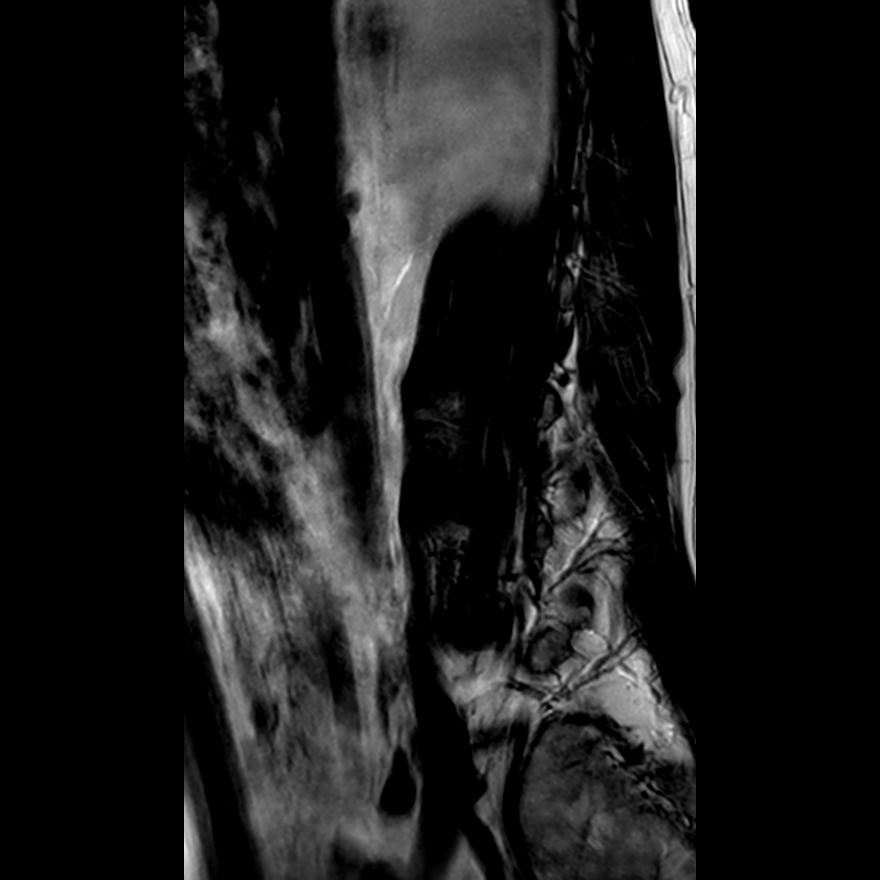

[Series 501: t1w_tse sag · sagittal · 4.0mm · 0.39mm/px · 3 of 17 slices shown]
[im 3/17]
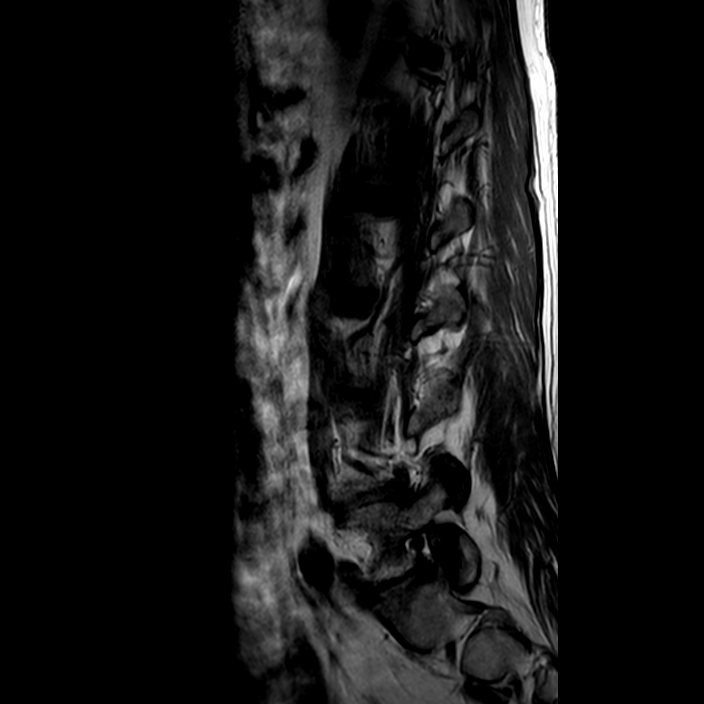
[im 9/17]
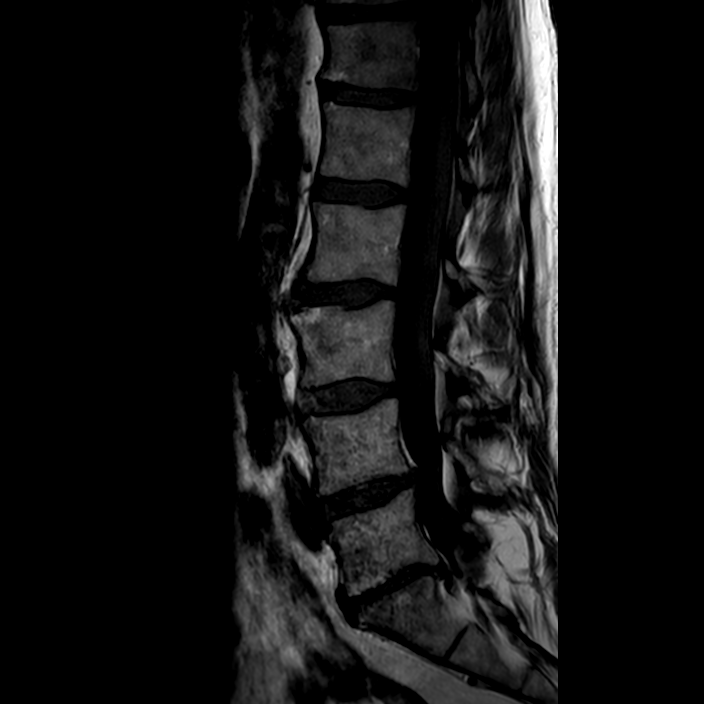
[im 15/17]
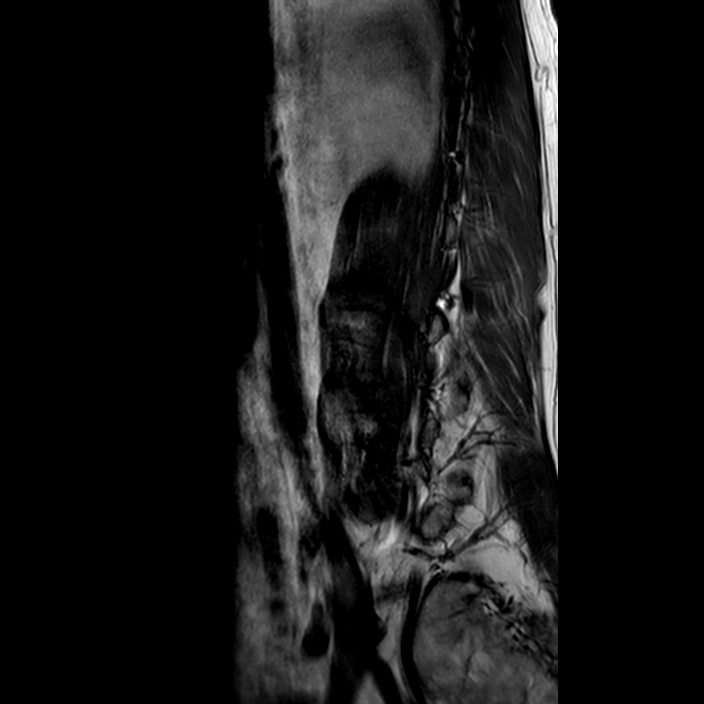

[11 of 48 positions shown; findings below may reference images not displayed]

FINDINGS: -------------------------------------------------------------------------------- 
------ 
GENERAL: 
Nomenclature is based on 5 lumbar type vertebral bodies.     
ALIGNMENT: Very mild retrolisthesis of L2 relative to L3 and L5 relative to S1. 
VERTEBRAL BODY HEIGHT: Normal.  
MARROW SIGNAL: No focal suspect signal abnormality. 
CORD SIGNAL: Normal distal spinal cord and cauda equina.  Conus medullaris 
terminates at L2. 
ADDITIONAL FINDINGS: None. 
Modic I-II: L5-S1 
Ligamentum Flavum > 2.5 mm: All levels except L5-S1 
-------------------------------------------------------------------------------- 
------ 
SEGMENTAL: 
T12-L1: No significant central canal narrowing.  No significant right neural 
foraminal narrowing. No significant left neural foraminal narrowing.  
L1-L2: No significant central canal narrowing.  No significant right neural 
foraminal narrowing. No significant left neural foraminal narrowing.  
L2-L3: Loss of disc height with disc bulge. No significant central canal 
narrowing.  No significant right neural foraminal narrowing. No significant left 
neural foraminal narrowing.  
L3-L4: Mild loss of disc height posteriorly. Trace disc bulge. Left facet 
hypertrophy. No significant central canal narrowing.  No significant right 
neural foraminal narrowing. No significant left neural foraminal narrowing.  
L4-L5: Mild loss of disc height with disc bulge. Bilateral facet hypertrophy. 
Moderate central canal narrowing with severe right and moderate left 
subarticular recess narrowing.  Mild right neural foraminal narrowing. Moderate 
left neural foraminal narrowing.  
L5-S1: Loss of disc height with disc bulge. Central disc herniation extending 
inferiorly. Bilateral facet hypertrophy. Overall no significant central canal 
narrowing.  Mild right neural foraminal narrowing. Mild left neural foraminal 
narrowing.  
-------------------------------------------------------------------------------- 
------
IMPRESSION: Discogenic/degenerative changes as above. 
Worst level(s) of central canal/subarticular recess narrowing: L4-L5 
Worst level(s) of neural foraminal narrowing: L4-L5

## 2022-12-23 IMAGING — MR MRI LUMBAR SPINE W/WO CONTRAST
8 of 12 series · 16 of 48 positions shown · IV contrast (Gadolinium)
Comparison: MR lumbar spine May 09, 2022.

________________________________________________________________________________________________ 
MRI LUMBAR SPINE W/WO CONTRAST, 12/23/2022 [DATE]: 
CLINICAL INDICATION: Chronic low back pain. No radiation. Lumbar surgery 
May 2022. Initial lumbar discectomy in 4444.
TECHNIQUE: Multiplanar, multiecho position MR images of the lumbar spine were 
performed without and with 8.5 mL of Gadavist were injected intravenously by 
hand. 1.5 mL of Gadavist were discarded. Patient was scanned on a 1.5T magnet.

[Series 101: survey · axial · 10.0mm · 1.21mm/px · 1 of 10 slices shown]
[im 1/10]
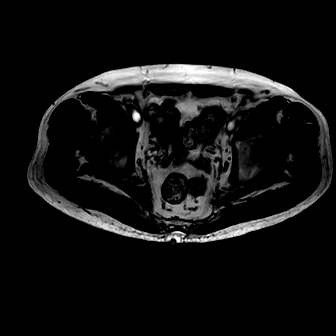

[Series 201: t2w_cor-surv · coronal · 6.0mm · 0.62mm/px · 1 of 14 slices shown]
[im 1/14]
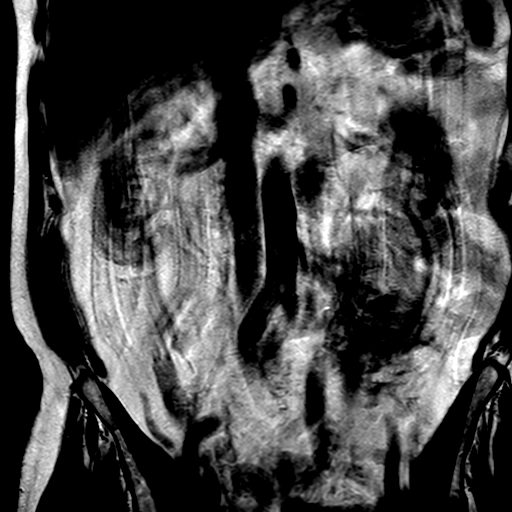

[Series 301: T1 · sagittal · 4.0mm · 0.41mm/px · 2 of 19 slices shown (1 of 2)]
[im 1/19]
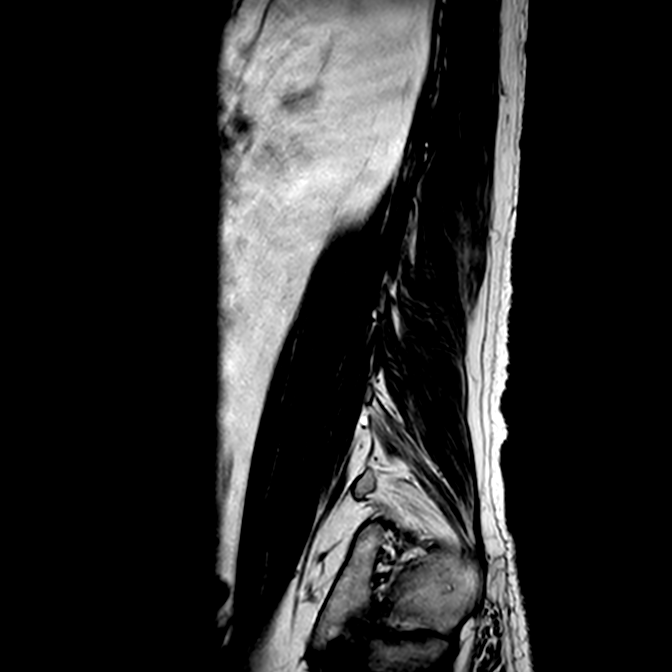
[im 19/19]
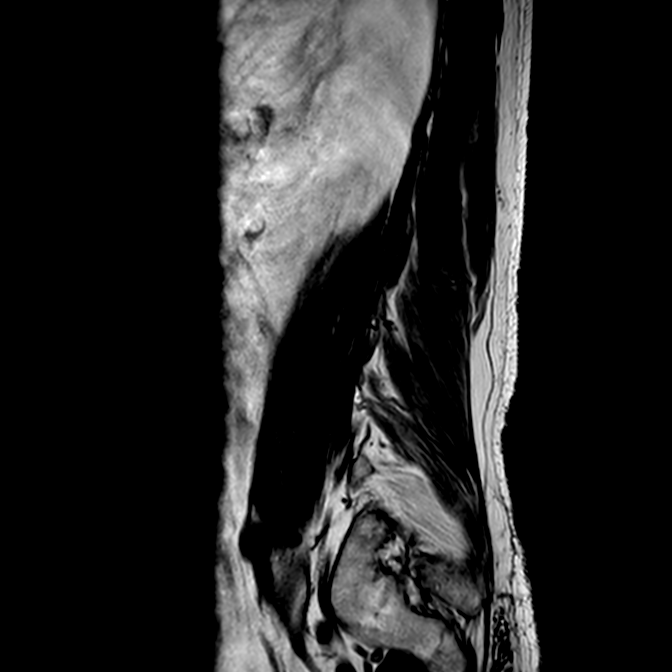

[Series 402: (id)_mdixon_tse · sagittal · 4.0mm · 0.50mm/px · 2 of 19 slices shown]
[im 1/19]
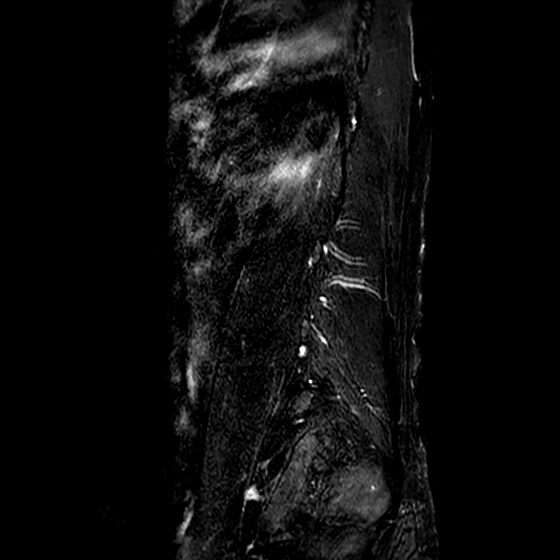
[im 19/19]
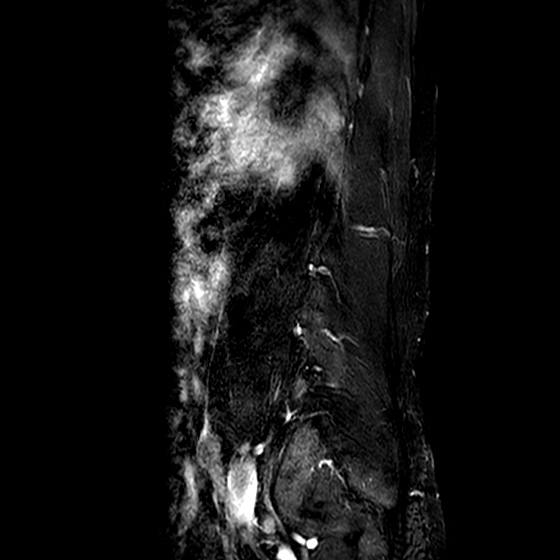

[Series 403: st2w_mdixon_tse · sagittal · 4.0mm · 0.50mm/px · 1 of 19 slices shown]
[im 1/19]
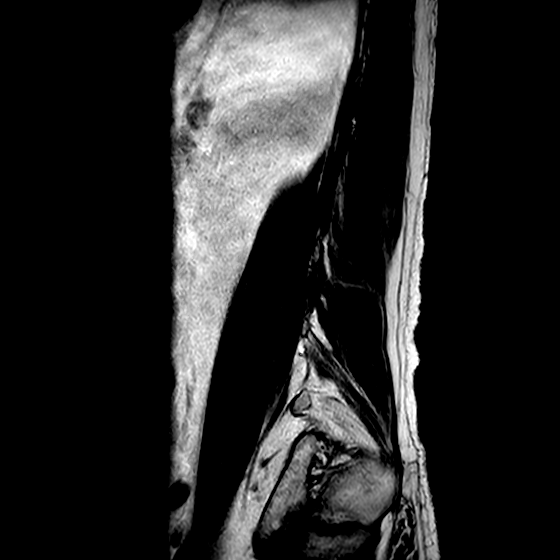

[Series 601: T2 · axial · 4.0mm · 0.30mm/px · z∈[-40,+210]mm · 3 of 30 slices shown]
[im 1/30]
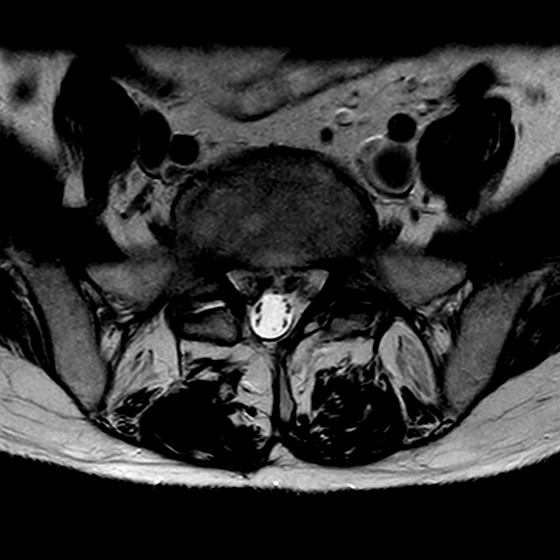
[im 15/30]
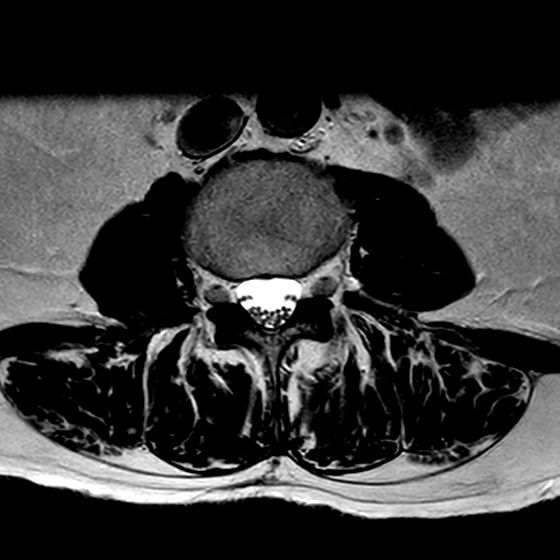
[im 30/30]
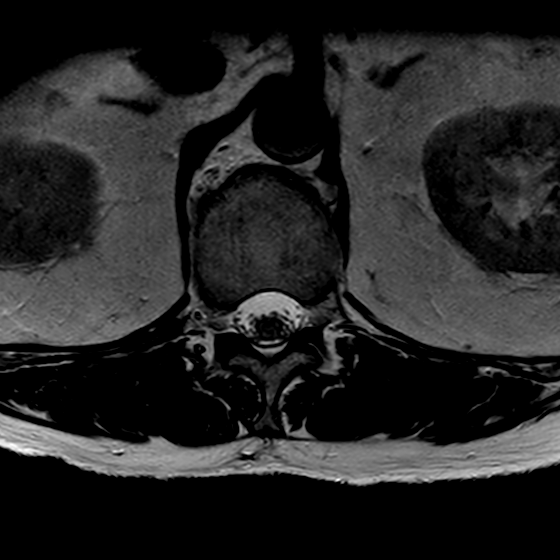

[Series 701: T1 · axial · 4.0mm · 0.33mm/px · z∈[-40,+210]mm · 3 of 30 slices shown (2 of 2)]
[im 1/30]
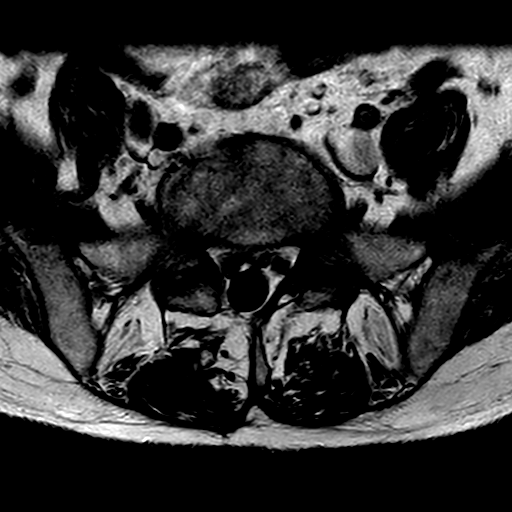
[im 15/30]
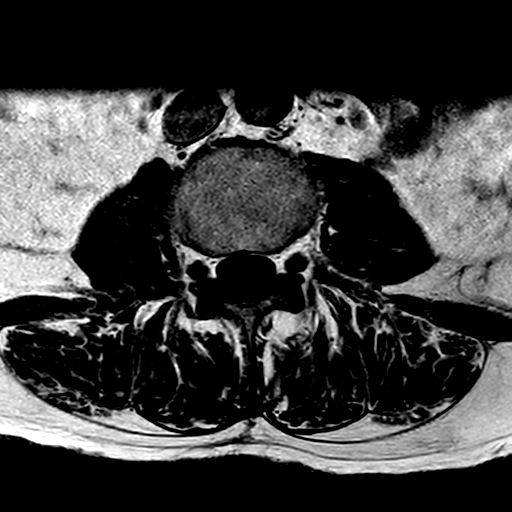
[im 30/30]
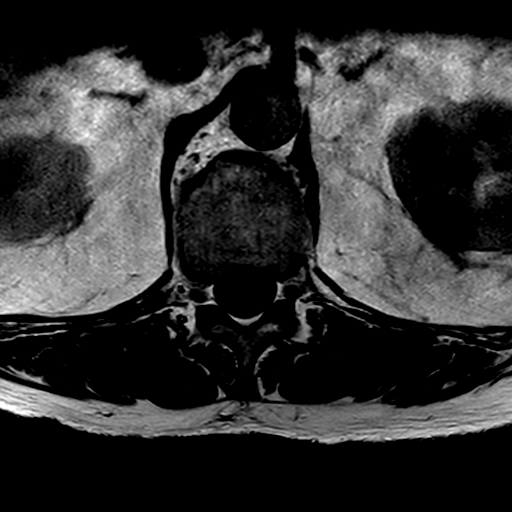

[Series 901: T1 post-contrast · axial · 4.0mm · 0.33mm/px · z∈[-40,+210]mm · 3 of 30 slices shown]
[im 1/30]
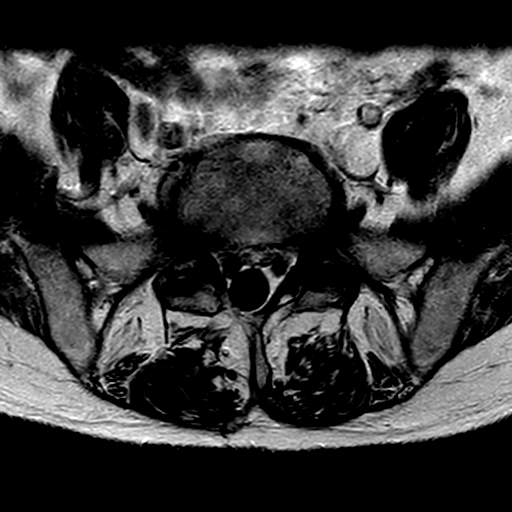
[im 15/30]
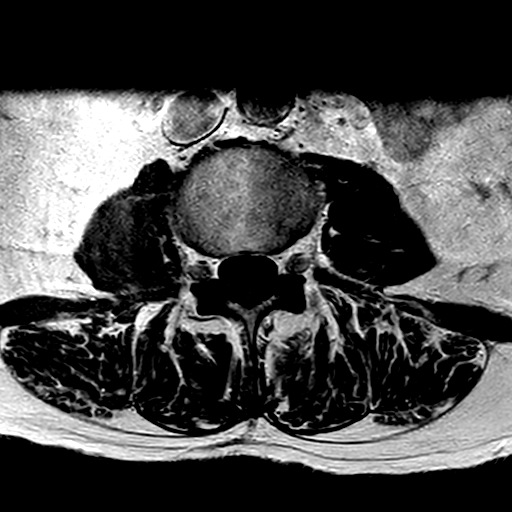
[im 30/30]
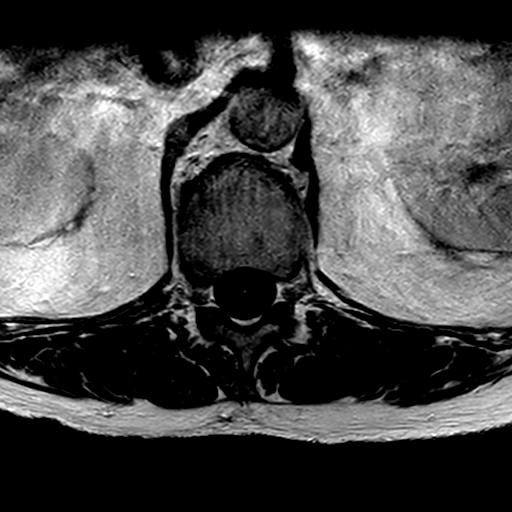

[16 of 48 positions shown; findings below may reference images not displayed]

FINDINGS: Previous right hemilaminectomy L4-L5 with minimal, presumed granulation tissue 
that is enhancing along the right anterolateral aspect of the thecal sac, image 
7 series 901. No discitis, osteomyelitis, or epidural abscess. 
-------------------------------------------------------------------------------- 
------ 
GENERAL: 
Nomenclature is based on 5 lumbar type vertebral bodies.     
ALIGNMENT: Minimal dextroconvex lumbar scoliosis. Slight loss of the normal 
lumbar lordosis with trace grade 1 retrolisthesis L5 on S1. 
VERTEBRAL BODY HEIGHT: Normal.  
MARROW SIGNAL: No focal suspect signal abnormality. 
CORD SIGNAL: Normal distal spinal cord and cauda equina. Conus medullaris 
terminates at L1-L2. 
ADDITIONAL FINDINGS: None. 
Modic I-II: Inferior endplate L3, L5-S1 
Ligamentum Flavum > 2.5 mm: All except the laminectomy site. 
-------------------------------------------------------------------------------- 
------ 
SEGMENTAL: 
T12-L1: Mild loss of disc height and signal. Canal and foramina are patent. 
Normal facets. Stable. 
L1-L2: Slight loss of disc signal. Otherwise normal. Stable. 
L2-L3: Loss of disc signal. Minimal annular bulge. Small bilateral facet joint 
effusions. Otherwise normal. Stable. 
L3-L4: Loss of disc signal. Minimal annular bulge. Subtle central annular 
fissure. Canal and foramina are patent. Left-sided facet arthropathy. Stable. 
L4-L5: Mild loss of disc height and signal. Diffuse annular bulge is eccentric 
to the right. Despite right hemilaminectomy change, mild to moderate canal 
stenosis persists, although the canal is slightly more patent in the interval. 
There remains lateral recess narrowing bilaterally, specifically on the right 
that could impact the traversing L5 nerve roots bilaterally. Facet arthropathy 
with small bilateral facet joint effusions. Right foramen mildly narrowed. Left 
foramen mild to moderately narrowed. Foraminal appearance is stable. 
L5-S1: Moderate loss of disc height. Loss of disc signal. Vacuum disc phenomenon 
suspected. Mild diffuse annular bulge. Retrolisthesis with a degree of disc 
unroofing. Bulging disc abuts the traversing S1 nerve roots but without definite 
nerve root distortion. Canal patent. Mild left and right foraminal narrowing. 
Facet arthropathy. Stable. 
-------------------------------------------------------------------------------- 
------
IMPRESSION: Previous right hemilaminectomy L4-L5. There is granulation tissue and diffuse 
annular bulge, eccentric to the right, creating lateral recess narrowing 
bilaterally, specifically on the right, that could impact the traversing L5 
nerve roots bilaterally. The disc bulge is more conspicuous in the interval, 
best appreciated on the sagittal images. Mild to moderate left foraminal 
narrowing with mild right foraminal narrowing, stable. 
Other stable appearing lumbar degenerative changes without critical or 
significant central canal narrowing.

## 2023-10-30 IMAGING — CT CT CALCIUM SCORING
1 series · 15 of 20 positions shown, 19 images · non-contrast
Comparison: There are no previous exams available for comparison.

________________________________________________________________________________________________ 
CT CALCIUM SCORING, 10/30/2023 [DATE]:
INDICATION: Hyperlipidemia, Unspecified

[Series 2: cascoreseq 3.0 b35s 60% · axial · 0.39mm/px · z∈[-261,-108]mm · 15 of 114 slices shown, 19 images]
[im 6/114  vessel]
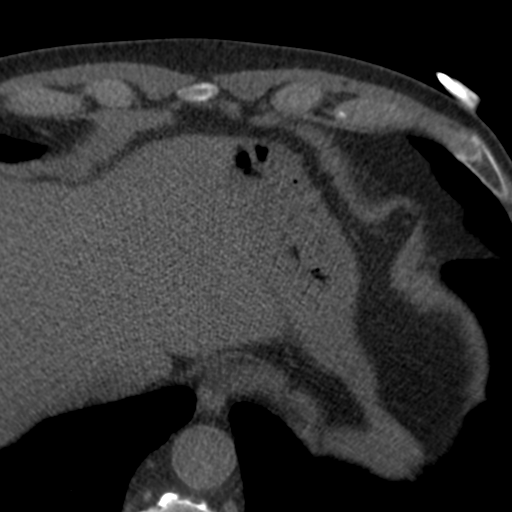
[im 6/114  lung]
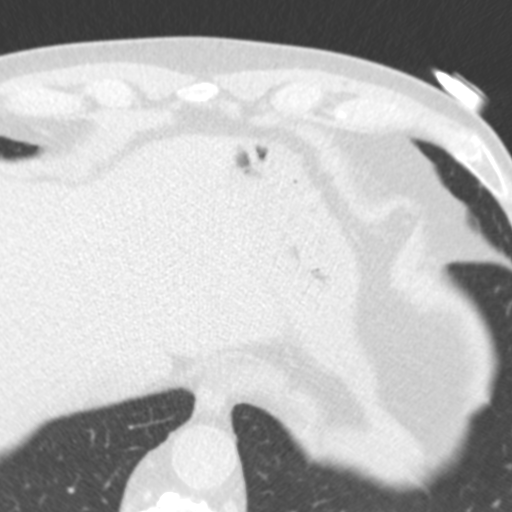
[im 12/114  vessel]
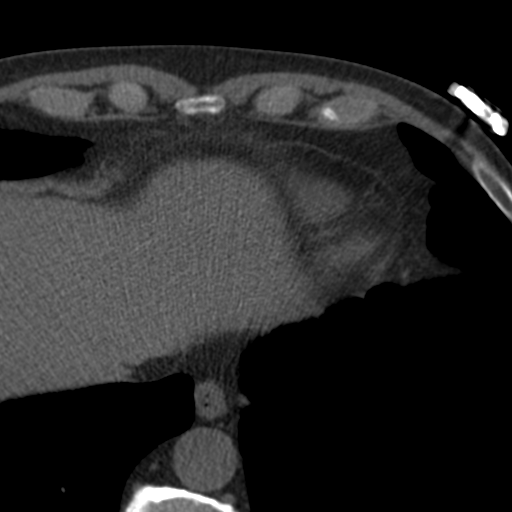
[im 24/114  vessel]
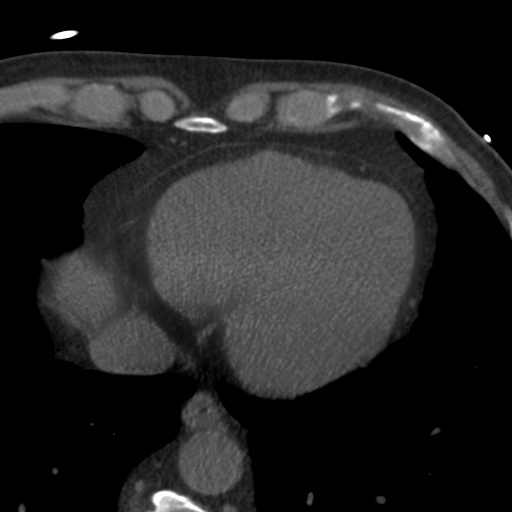
[im 30/114  vessel]
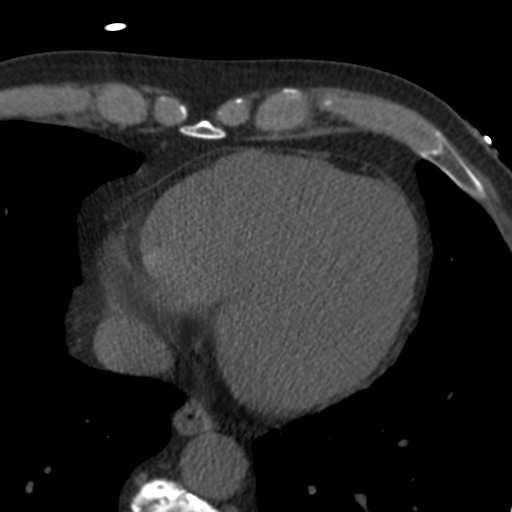
[im 36/114  vessel]
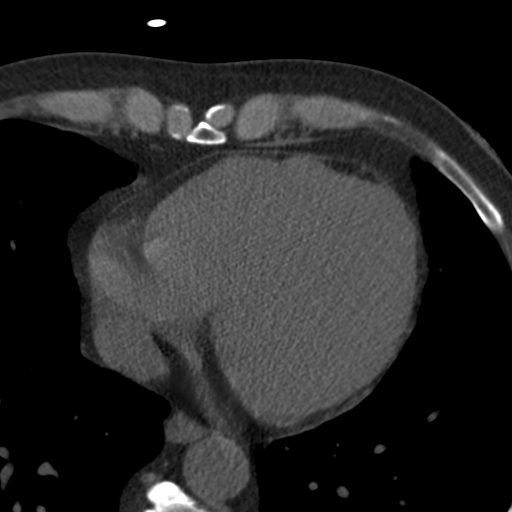
[im 36/114  lung]
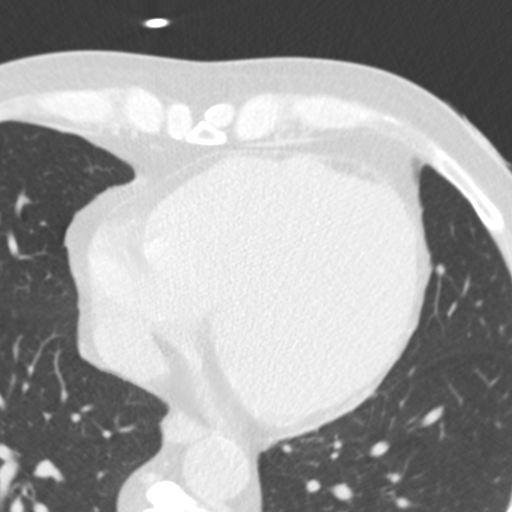
[im 42/114  vessel]
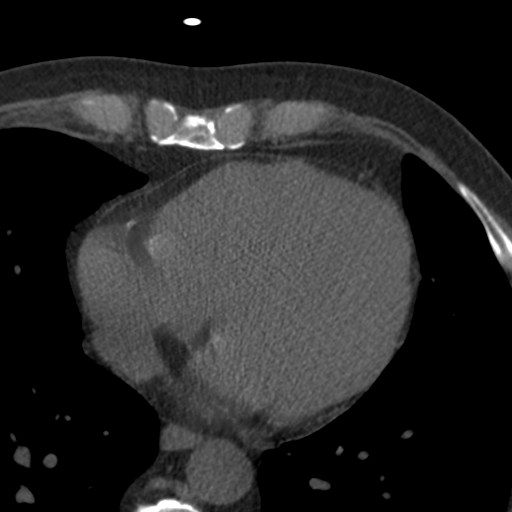
[im 48/114  vessel]
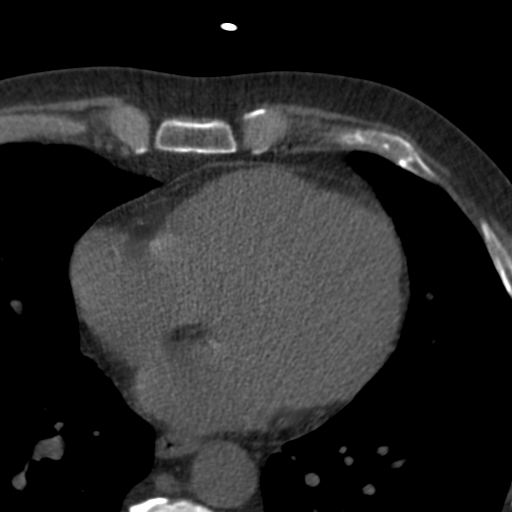
[im 60/114  vessel]
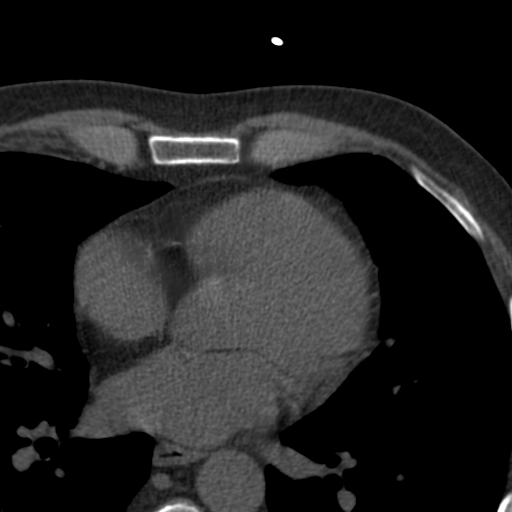
[im 66/114  vessel]
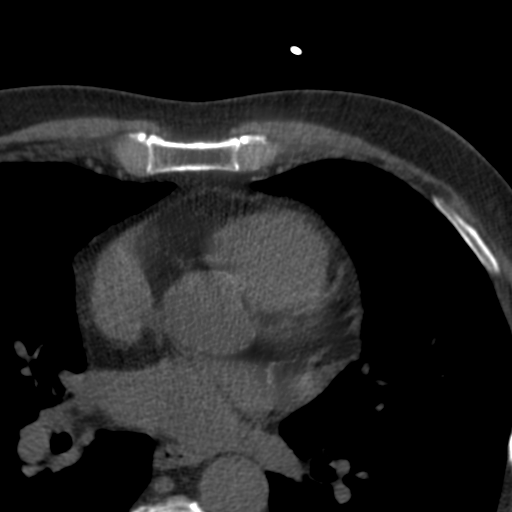
[im 66/114  lung]
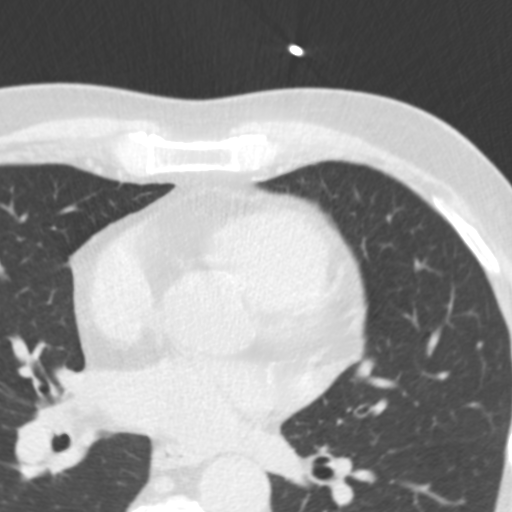
[im 72/114  vessel]
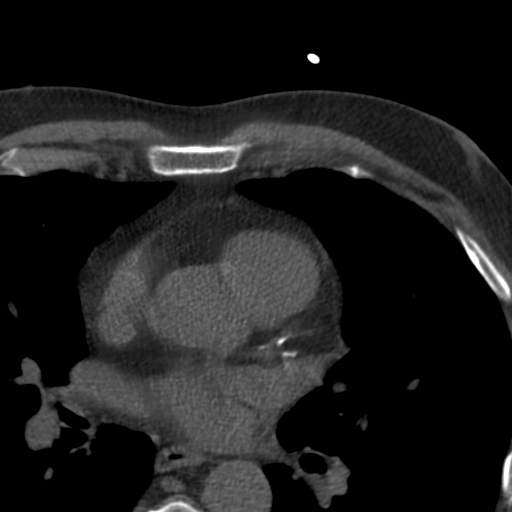
[im 78/114  vessel]
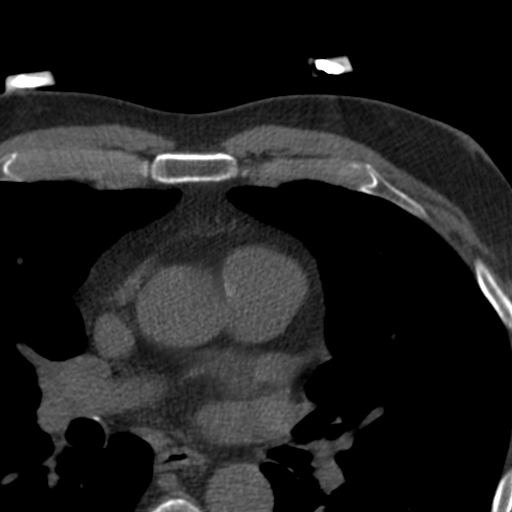
[im 84/114  vessel]
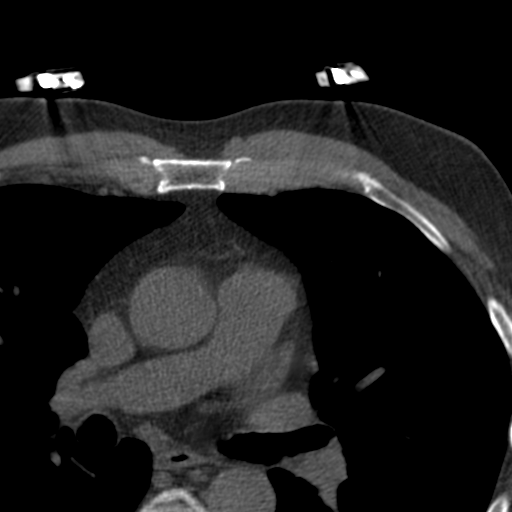
[im 96/114  vessel]
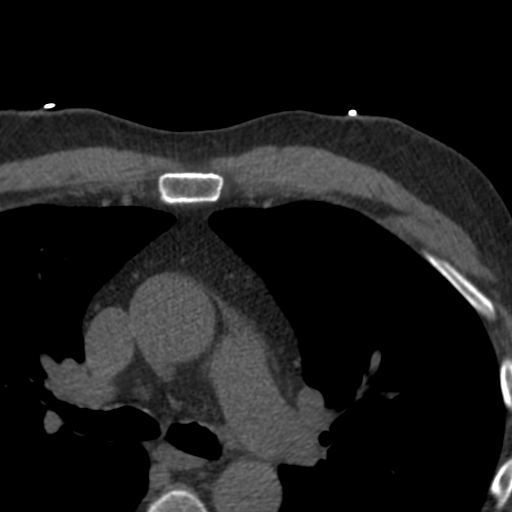
[im 96/114  lung]
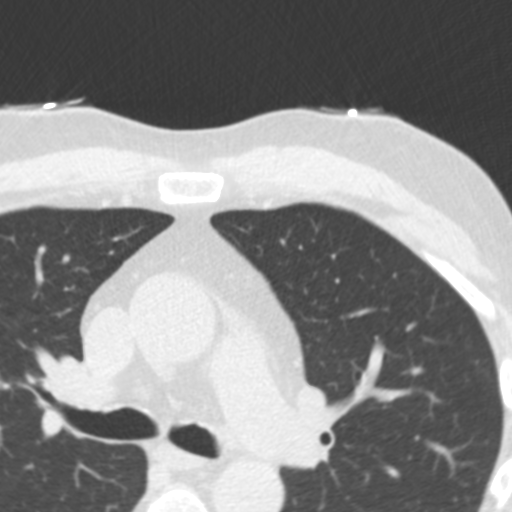
[im 102/114  vessel]
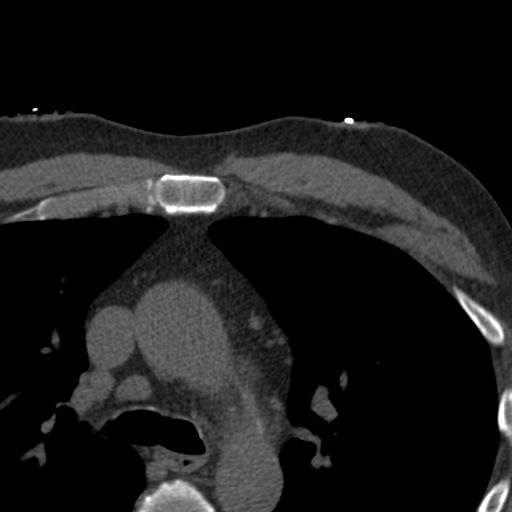
[im 108/114  vessel]
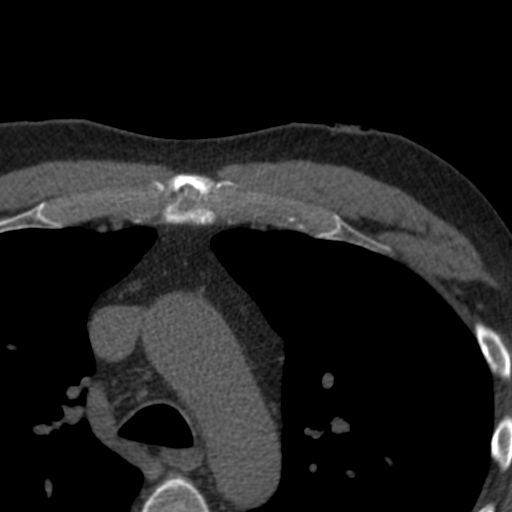

[15 of 20 positions shown; findings below may reference images not displayed]

Count of known CT and Cardiac Nuclear Medicine studies performed in the previous 
12 months = 0
FINDINGS: Visualized lungs are clear.
IMPRESSION: Left Main (LM) Score: 0 
Left Anterior Descending Artery (LAD) Score: 78 
Circumflex (CM) Score: 15 
Right Coronary Artery (RCA) Score: 20 
Posterior Descending Artery (PDA) Score: 7 
Other: 13
IMPRESSION: Total Calcium Score: 133 
No ancillary findings. 
If calcium score is greater than 100 and less than 0666 would recommend 
follow-up with coronary CTA with plaque analysis. 
Calcium scoring sheets to follow. 
RADIATION DOSE REDUCTION: All CT scans are performed using radiation dose 
reduction techniques, when applicable.  Technical factors are evaluated and 
adjusted to ensure appropriate moderation of exposure.  Automated dose 
management technology is applied to adjust the radiation doses to minimize 
exposure while achieving diagnostic quality images. 
Consider further evaluation if multi-vessel or left-main predominant disease is 
present. 
Calcium score                                     Presence of Plaque 
0                                                    No evidence of plaque 
1-10                                               Minimal evidence of plaque 
11-100                                            Mild evidence of plaque 
101-400                                          Moderate evidence of plaque 
Over 400                                        Extensive evidence of plaque
